# Patient Record
Sex: Female | Born: 1965 | Race: Black or African American | Hispanic: No
Health system: Southern US, Community
[De-identification: ages and names within clinical notes are randomized; demographics above are authoritative.]

---

## 2000-07-30 ENCOUNTER — Emergency Department (HOSPITAL_COMMUNITY): Admission: EM | Admit: 2000-07-30 | Discharge: 2000-07-30 | Payer: Self-pay | Admitting: Emergency Medicine

## 2000-07-30 ENCOUNTER — Encounter: Payer: Self-pay | Admitting: Emergency Medicine

## 2000-07-31 ENCOUNTER — Ambulatory Visit (HOSPITAL_COMMUNITY): Admission: RE | Admit: 2000-07-31 | Discharge: 2000-07-31 | Payer: Self-pay | Admitting: General Surgery

## 2000-07-31 ENCOUNTER — Encounter: Payer: Self-pay | Admitting: General Surgery

## 2001-03-15 ENCOUNTER — Encounter: Payer: Self-pay | Admitting: Obstetrics and Gynecology

## 2001-03-15 ENCOUNTER — Ambulatory Visit (HOSPITAL_COMMUNITY): Admission: RE | Admit: 2001-03-15 | Discharge: 2001-03-15 | Payer: Self-pay | Admitting: Obstetrics and Gynecology

## 2001-06-25 ENCOUNTER — Encounter: Payer: Self-pay | Admitting: Obstetrics and Gynecology

## 2001-06-25 ENCOUNTER — Ambulatory Visit (HOSPITAL_COMMUNITY): Admission: RE | Admit: 2001-06-25 | Discharge: 2001-06-25 | Payer: Self-pay | Admitting: Obstetrics and Gynecology

## 2001-12-02 ENCOUNTER — Encounter (HOSPITAL_COMMUNITY): Admission: RE | Admit: 2001-12-02 | Discharge: 2002-01-01 | Payer: Self-pay | Admitting: Preventative Medicine

## 2002-09-08 ENCOUNTER — Ambulatory Visit (HOSPITAL_COMMUNITY): Admission: RE | Admit: 2002-09-08 | Discharge: 2002-09-08 | Payer: Self-pay | Admitting: Obstetrics and Gynecology

## 2002-09-08 ENCOUNTER — Encounter: Payer: Self-pay | Admitting: Obstetrics and Gynecology

## 2003-10-17 ENCOUNTER — Ambulatory Visit (HOSPITAL_COMMUNITY): Admission: RE | Admit: 2003-10-17 | Discharge: 2003-10-17 | Payer: Self-pay | Admitting: Internal Medicine

## 2005-03-25 ENCOUNTER — Ambulatory Visit (HOSPITAL_COMMUNITY): Admission: RE | Admit: 2005-03-25 | Discharge: 2005-03-25 | Payer: Self-pay | Admitting: *Deleted

## 2006-04-09 ENCOUNTER — Ambulatory Visit (HOSPITAL_COMMUNITY): Admission: RE | Admit: 2006-04-09 | Discharge: 2006-04-09 | Payer: Self-pay | Admitting: Unknown Physician Specialty

## 2007-04-12 ENCOUNTER — Ambulatory Visit (HOSPITAL_COMMUNITY): Admission: RE | Admit: 2007-04-12 | Discharge: 2007-04-12 | Payer: Self-pay | Admitting: Unknown Physician Specialty

## 2008-04-28 ENCOUNTER — Ambulatory Visit (HOSPITAL_COMMUNITY): Admission: RE | Admit: 2008-04-28 | Discharge: 2008-04-28 | Payer: Self-pay | Admitting: Unknown Physician Specialty

## 2009-05-17 ENCOUNTER — Ambulatory Visit (HOSPITAL_COMMUNITY): Admission: RE | Admit: 2009-05-17 | Discharge: 2009-05-17 | Payer: Self-pay | Admitting: Unknown Physician Specialty

## 2010-06-10 ENCOUNTER — Other Ambulatory Visit (HOSPITAL_COMMUNITY): Payer: Self-pay | Admitting: Unknown Physician Specialty

## 2010-06-10 DIAGNOSIS — Z139 Encounter for screening, unspecified: Secondary | ICD-10-CM

## 2010-06-13 ENCOUNTER — Ambulatory Visit (HOSPITAL_COMMUNITY): Payer: 59

## 2010-06-17 ENCOUNTER — Ambulatory Visit (HOSPITAL_COMMUNITY)
Admission: RE | Admit: 2010-06-17 | Discharge: 2010-06-17 | Disposition: A | Discharge status: Home / Self Care | Payer: 59 | Source: Ambulatory Visit | Attending: Unknown Physician Specialty | Admitting: Unknown Physician Specialty

## 2010-06-17 DIAGNOSIS — Z1231 Encounter for screening mammogram for malignant neoplasm of breast: Secondary | ICD-10-CM | POA: Insufficient documentation

## 2010-06-17 DIAGNOSIS — Z139 Encounter for screening, unspecified: Secondary | ICD-10-CM

## 2010-06-19 ENCOUNTER — Other Ambulatory Visit: Payer: Self-pay | Admitting: Obstetrics and Gynecology

## 2010-06-19 DIAGNOSIS — R928 Other abnormal and inconclusive findings on diagnostic imaging of breast: Secondary | ICD-10-CM

## 2010-06-26 ENCOUNTER — Ambulatory Visit (HOSPITAL_COMMUNITY)
Admission: RE | Admit: 2010-06-26 | Discharge: 2010-06-26 | Disposition: A | Discharge status: Home / Self Care | Payer: 59 | Source: Ambulatory Visit | Attending: Unknown Physician Specialty | Admitting: Unknown Physician Specialty

## 2010-06-26 ENCOUNTER — Other Ambulatory Visit: Payer: Self-pay | Admitting: Obstetrics and Gynecology

## 2010-06-26 ENCOUNTER — Ambulatory Visit (HOSPITAL_COMMUNITY)
Admission: RE | Admit: 2010-06-26 | Discharge: 2010-06-26 | Disposition: A | Discharge status: Home / Self Care | Payer: 59 | Source: Ambulatory Visit | Attending: Obstetrics and Gynecology | Admitting: Obstetrics and Gynecology

## 2010-06-26 DIAGNOSIS — R928 Other abnormal and inconclusive findings on diagnostic imaging of breast: Secondary | ICD-10-CM

## 2011-06-23 ENCOUNTER — Other Ambulatory Visit (HOSPITAL_COMMUNITY): Payer: Self-pay | Admitting: Physician Assistant

## 2011-06-23 ENCOUNTER — Other Ambulatory Visit (HOSPITAL_COMMUNITY): Payer: Self-pay | Admitting: Unknown Physician Specialty

## 2011-06-23 DIAGNOSIS — Z139 Encounter for screening, unspecified: Secondary | ICD-10-CM

## 2011-06-30 ENCOUNTER — Ambulatory Visit (HOSPITAL_COMMUNITY)
Admission: RE | Admit: 2011-06-30 | Discharge: 2011-06-30 | Disposition: A | Discharge status: Home / Self Care | Payer: 59 | Source: Ambulatory Visit | Attending: Physician Assistant | Admitting: Physician Assistant

## 2011-06-30 ENCOUNTER — Ambulatory Visit (HOSPITAL_COMMUNITY): Payer: 59

## 2011-06-30 DIAGNOSIS — Z139 Encounter for screening, unspecified: Secondary | ICD-10-CM

## 2011-06-30 DIAGNOSIS — Z1231 Encounter for screening mammogram for malignant neoplasm of breast: Secondary | ICD-10-CM | POA: Insufficient documentation

## 2012-08-12 ENCOUNTER — Other Ambulatory Visit (HOSPITAL_COMMUNITY): Payer: Self-pay | Admitting: Physician Assistant

## 2012-08-12 DIAGNOSIS — Z139 Encounter for screening, unspecified: Secondary | ICD-10-CM

## 2012-08-16 ENCOUNTER — Ambulatory Visit (HOSPITAL_COMMUNITY)
Admission: RE | Admit: 2012-08-16 | Discharge: 2012-08-16 | Disposition: A | Discharge status: Home / Self Care | Payer: 59 | Source: Ambulatory Visit | Attending: Physician Assistant | Admitting: Physician Assistant

## 2012-08-16 DIAGNOSIS — Z139 Encounter for screening, unspecified: Secondary | ICD-10-CM

## 2012-08-16 DIAGNOSIS — Z1231 Encounter for screening mammogram for malignant neoplasm of breast: Secondary | ICD-10-CM | POA: Insufficient documentation

## 2013-08-23 ENCOUNTER — Other Ambulatory Visit (HOSPITAL_COMMUNITY): Payer: Self-pay | Admitting: Physician Assistant

## 2013-08-23 DIAGNOSIS — Z1231 Encounter for screening mammogram for malignant neoplasm of breast: Secondary | ICD-10-CM

## 2013-08-26 ENCOUNTER — Ambulatory Visit (HOSPITAL_COMMUNITY): Payer: 59

## 2013-08-29 ENCOUNTER — Ambulatory Visit (HOSPITAL_COMMUNITY)
Admission: RE | Admit: 2013-08-29 | Discharge: 2013-08-29 | Disposition: A | Discharge status: Home / Self Care | Payer: 59 | Source: Ambulatory Visit | Attending: Physician Assistant | Admitting: Physician Assistant

## 2013-08-29 DIAGNOSIS — R928 Other abnormal and inconclusive findings on diagnostic imaging of breast: Secondary | ICD-10-CM | POA: Insufficient documentation

## 2013-08-29 DIAGNOSIS — Z1231 Encounter for screening mammogram for malignant neoplasm of breast: Secondary | ICD-10-CM | POA: Insufficient documentation

## 2013-08-31 ENCOUNTER — Other Ambulatory Visit: Payer: Self-pay | Admitting: Physician Assistant

## 2013-08-31 DIAGNOSIS — R928 Other abnormal and inconclusive findings on diagnostic imaging of breast: Secondary | ICD-10-CM

## 2013-09-14 ENCOUNTER — Other Ambulatory Visit: Payer: Self-pay | Admitting: Physician Assistant

## 2013-09-14 ENCOUNTER — Ambulatory Visit (HOSPITAL_COMMUNITY)
Admission: RE | Admit: 2013-09-14 | Discharge: 2013-09-14 | Disposition: A | Discharge status: Home / Self Care | Payer: 59 | Source: Ambulatory Visit | Attending: Physician Assistant | Admitting: Physician Assistant

## 2013-09-14 DIAGNOSIS — R928 Other abnormal and inconclusive findings on diagnostic imaging of breast: Secondary | ICD-10-CM

## 2014-11-07 ENCOUNTER — Other Ambulatory Visit (HOSPITAL_COMMUNITY): Payer: Self-pay | Admitting: Physician Assistant

## 2014-11-07 DIAGNOSIS — Z1231 Encounter for screening mammogram for malignant neoplasm of breast: Secondary | ICD-10-CM

## 2014-11-10 ENCOUNTER — Ambulatory Visit (HOSPITAL_COMMUNITY)
Admission: RE | Admit: 2014-11-10 | Discharge: 2014-11-10 | Disposition: A | Discharge status: Home / Self Care | Payer: BLUE CROSS/BLUE SHIELD | Source: Ambulatory Visit | Attending: Physician Assistant | Admitting: Physician Assistant

## 2014-11-10 DIAGNOSIS — Z1231 Encounter for screening mammogram for malignant neoplasm of breast: Secondary | ICD-10-CM

## 2015-12-18 ENCOUNTER — Other Ambulatory Visit (HOSPITAL_COMMUNITY): Payer: Self-pay | Admitting: Physician Assistant

## 2015-12-18 DIAGNOSIS — Z1231 Encounter for screening mammogram for malignant neoplasm of breast: Secondary | ICD-10-CM

## 2015-12-24 ENCOUNTER — Ambulatory Visit (HOSPITAL_COMMUNITY): Payer: BLUE CROSS/BLUE SHIELD

## 2015-12-31 ENCOUNTER — Ambulatory Visit (HOSPITAL_COMMUNITY)
Admission: RE | Admit: 2015-12-31 | Discharge: 2015-12-31 | Disposition: A | Discharge status: Home / Self Care | Payer: BLUE CROSS/BLUE SHIELD | Source: Ambulatory Visit | Attending: Physician Assistant | Admitting: Physician Assistant

## 2015-12-31 DIAGNOSIS — Z1231 Encounter for screening mammogram for malignant neoplasm of breast: Secondary | ICD-10-CM | POA: Diagnosis not present

## 2016-08-07 ENCOUNTER — Ambulatory Visit (INDEPENDENT_AMBULATORY_CARE_PROVIDER_SITE_OTHER): Payer: BLUE CROSS/BLUE SHIELD | Admitting: Otolaryngology

## 2016-08-07 DIAGNOSIS — H903 Sensorineural hearing loss, bilateral: Secondary | ICD-10-CM | POA: Diagnosis not present

## 2016-08-07 DIAGNOSIS — H6123 Impacted cerumen, bilateral: Secondary | ICD-10-CM

## 2017-04-06 DIAGNOSIS — Z79899 Other long term (current) drug therapy: Secondary | ICD-10-CM | POA: Diagnosis not present

## 2017-04-06 DIAGNOSIS — K029 Dental caries, unspecified: Secondary | ICD-10-CM | POA: Diagnosis not present

## 2017-04-06 DIAGNOSIS — M1990 Unspecified osteoarthritis, unspecified site: Secondary | ICD-10-CM | POA: Diagnosis not present

## 2017-04-06 DIAGNOSIS — I10 Essential (primary) hypertension: Secondary | ICD-10-CM | POA: Diagnosis not present

## 2017-04-06 DIAGNOSIS — K0889 Other specified disorders of teeth and supporting structures: Secondary | ICD-10-CM | POA: Diagnosis not present

## 2017-04-15 DIAGNOSIS — I10 Essential (primary) hypertension: Secondary | ICD-10-CM | POA: Diagnosis not present

## 2017-04-15 DIAGNOSIS — N760 Acute vaginitis: Secondary | ICD-10-CM | POA: Diagnosis not present

## 2017-04-15 DIAGNOSIS — Z90711 Acquired absence of uterus with remaining cervical stump: Secondary | ICD-10-CM | POA: Diagnosis not present

## 2017-04-15 DIAGNOSIS — N939 Abnormal uterine and vaginal bleeding, unspecified: Secondary | ICD-10-CM | POA: Diagnosis not present

## 2017-04-15 DIAGNOSIS — Z79899 Other long term (current) drug therapy: Secondary | ICD-10-CM | POA: Diagnosis not present

## 2017-05-05 ENCOUNTER — Other Ambulatory Visit (HOSPITAL_COMMUNITY): Payer: Self-pay | Admitting: Physician Assistant

## 2017-05-05 DIAGNOSIS — Z1231 Encounter for screening mammogram for malignant neoplasm of breast: Secondary | ICD-10-CM

## 2017-05-06 DIAGNOSIS — R300 Dysuria: Secondary | ICD-10-CM | POA: Diagnosis not present

## 2017-05-06 DIAGNOSIS — R1084 Generalized abdominal pain: Secondary | ICD-10-CM | POA: Diagnosis not present

## 2017-05-06 DIAGNOSIS — Z6838 Body mass index (BMI) 38.0-38.9, adult: Secondary | ICD-10-CM | POA: Diagnosis not present

## 2017-05-11 ENCOUNTER — Ambulatory Visit (HOSPITAL_COMMUNITY): Payer: BLUE CROSS/BLUE SHIELD

## 2017-05-13 ENCOUNTER — Ambulatory Visit (HOSPITAL_COMMUNITY): Payer: BLUE CROSS/BLUE SHIELD

## 2017-05-18 ENCOUNTER — Ambulatory Visit (HOSPITAL_COMMUNITY)
Admission: RE | Admit: 2017-05-18 | Discharge: 2017-05-18 | Disposition: A | Discharge status: Home / Self Care | Payer: BLUE CROSS/BLUE SHIELD | Source: Ambulatory Visit | Attending: Physician Assistant | Admitting: Physician Assistant

## 2017-05-18 DIAGNOSIS — Z1231 Encounter for screening mammogram for malignant neoplasm of breast: Secondary | ICD-10-CM | POA: Diagnosis not present

## 2017-07-21 DIAGNOSIS — Z0000 Encounter for general adult medical examination without abnormal findings: Secondary | ICD-10-CM | POA: Diagnosis not present

## 2017-07-22 DIAGNOSIS — D485 Neoplasm of uncertain behavior of skin: Secondary | ICD-10-CM | POA: Diagnosis not present

## 2017-07-22 DIAGNOSIS — M2550 Pain in unspecified joint: Secondary | ICD-10-CM | POA: Diagnosis not present

## 2017-07-23 DIAGNOSIS — Z6838 Body mass index (BMI) 38.0-38.9, adult: Secondary | ICD-10-CM | POA: Diagnosis not present

## 2017-07-23 DIAGNOSIS — Z0000 Encounter for general adult medical examination without abnormal findings: Secondary | ICD-10-CM | POA: Diagnosis not present

## 2017-07-23 DIAGNOSIS — I10 Essential (primary) hypertension: Secondary | ICD-10-CM | POA: Diagnosis not present

## 2017-10-13 DIAGNOSIS — Z6841 Body Mass Index (BMI) 40.0 and over, adult: Secondary | ICD-10-CM | POA: Diagnosis not present

## 2017-10-13 DIAGNOSIS — Z1211 Encounter for screening for malignant neoplasm of colon: Secondary | ICD-10-CM | POA: Diagnosis not present

## 2017-10-27 DIAGNOSIS — I10 Essential (primary) hypertension: Secondary | ICD-10-CM | POA: Diagnosis not present

## 2017-10-27 DIAGNOSIS — Z9851 Tubal ligation status: Secondary | ICD-10-CM | POA: Diagnosis not present

## 2017-10-27 DIAGNOSIS — Z6841 Body Mass Index (BMI) 40.0 and over, adult: Secondary | ICD-10-CM | POA: Diagnosis not present

## 2017-10-27 DIAGNOSIS — M1990 Unspecified osteoarthritis, unspecified site: Secondary | ICD-10-CM | POA: Diagnosis not present

## 2017-10-27 DIAGNOSIS — Z90710 Acquired absence of both cervix and uterus: Secondary | ICD-10-CM | POA: Diagnosis not present

## 2017-10-27 DIAGNOSIS — E669 Obesity, unspecified: Secondary | ICD-10-CM | POA: Diagnosis not present

## 2017-10-27 DIAGNOSIS — Z79899 Other long term (current) drug therapy: Secondary | ICD-10-CM | POA: Diagnosis not present

## 2017-10-27 DIAGNOSIS — Z1211 Encounter for screening for malignant neoplasm of colon: Secondary | ICD-10-CM | POA: Diagnosis not present

## 2017-10-27 DIAGNOSIS — G4730 Sleep apnea, unspecified: Secondary | ICD-10-CM | POA: Diagnosis not present

## 2017-12-16 DIAGNOSIS — Z6841 Body Mass Index (BMI) 40.0 and over, adult: Secondary | ICD-10-CM | POA: Diagnosis not present

## 2017-12-16 DIAGNOSIS — I10 Essential (primary) hypertension: Secondary | ICD-10-CM | POA: Diagnosis not present

## 2017-12-16 DIAGNOSIS — Z1331 Encounter for screening for depression: Secondary | ICD-10-CM | POA: Diagnosis not present

## 2017-12-16 DIAGNOSIS — Z1389 Encounter for screening for other disorder: Secondary | ICD-10-CM | POA: Diagnosis not present

## 2017-12-24 DIAGNOSIS — Z6839 Body mass index (BMI) 39.0-39.9, adult: Secondary | ICD-10-CM | POA: Diagnosis not present

## 2017-12-24 DIAGNOSIS — I10 Essential (primary) hypertension: Secondary | ICD-10-CM | POA: Diagnosis not present

## 2017-12-24 DIAGNOSIS — N3001 Acute cystitis with hematuria: Secondary | ICD-10-CM | POA: Diagnosis not present

## 2018-01-18 DIAGNOSIS — Z23 Encounter for immunization: Secondary | ICD-10-CM | POA: Diagnosis not present

## 2018-02-01 DIAGNOSIS — R1084 Generalized abdominal pain: Secondary | ICD-10-CM | POA: Diagnosis not present

## 2018-02-01 DIAGNOSIS — Z6839 Body mass index (BMI) 39.0-39.9, adult: Secondary | ICD-10-CM | POA: Diagnosis not present

## 2018-02-03 DIAGNOSIS — R319 Hematuria, unspecified: Secondary | ICD-10-CM | POA: Diagnosis not present

## 2018-02-03 DIAGNOSIS — N898 Other specified noninflammatory disorders of vagina: Secondary | ICD-10-CM | POA: Diagnosis not present

## 2018-02-03 DIAGNOSIS — Z6841 Body Mass Index (BMI) 40.0 and over, adult: Secondary | ICD-10-CM | POA: Diagnosis not present

## 2018-02-15 DIAGNOSIS — R319 Hematuria, unspecified: Secondary | ICD-10-CM | POA: Diagnosis not present

## 2018-02-15 DIAGNOSIS — Z90710 Acquired absence of both cervix and uterus: Secondary | ICD-10-CM | POA: Diagnosis not present

## 2018-02-15 DIAGNOSIS — N898 Other specified noninflammatory disorders of vagina: Secondary | ICD-10-CM | POA: Diagnosis not present

## 2018-04-20 DIAGNOSIS — R05 Cough: Secondary | ICD-10-CM | POA: Diagnosis not present

## 2018-04-20 DIAGNOSIS — J0101 Acute recurrent maxillary sinusitis: Secondary | ICD-10-CM | POA: Diagnosis not present

## 2018-04-20 DIAGNOSIS — Z6839 Body mass index (BMI) 39.0-39.9, adult: Secondary | ICD-10-CM | POA: Diagnosis not present

## 2018-04-20 DIAGNOSIS — S83411A Sprain of medial collateral ligament of right knee, initial encounter: Secondary | ICD-10-CM | POA: Diagnosis not present

## 2018-04-25 DIAGNOSIS — N898 Other specified noninflammatory disorders of vagina: Secondary | ICD-10-CM | POA: Diagnosis not present

## 2018-04-25 DIAGNOSIS — R51 Headache: Secondary | ICD-10-CM | POA: Diagnosis not present

## 2018-04-25 DIAGNOSIS — N760 Acute vaginitis: Secondary | ICD-10-CM | POA: Diagnosis not present

## 2018-05-20 DIAGNOSIS — R600 Localized edema: Secondary | ICD-10-CM | POA: Diagnosis not present

## 2018-05-20 DIAGNOSIS — Z6838 Body mass index (BMI) 38.0-38.9, adult: Secondary | ICD-10-CM | POA: Diagnosis not present

## 2018-05-20 DIAGNOSIS — R05 Cough: Secondary | ICD-10-CM | POA: Diagnosis not present

## 2018-06-22 DIAGNOSIS — R300 Dysuria: Secondary | ICD-10-CM | POA: Diagnosis not present

## 2018-06-22 DIAGNOSIS — R05 Cough: Secondary | ICD-10-CM | POA: Diagnosis not present

## 2018-06-22 DIAGNOSIS — Z6838 Body mass index (BMI) 38.0-38.9, adult: Secondary | ICD-10-CM | POA: Diagnosis not present

## 2018-06-22 DIAGNOSIS — R350 Frequency of micturition: Secondary | ICD-10-CM | POA: Diagnosis not present

## 2018-06-23 DIAGNOSIS — R319 Hematuria, unspecified: Secondary | ICD-10-CM | POA: Diagnosis not present

## 2018-06-23 DIAGNOSIS — R1084 Generalized abdominal pain: Secondary | ICD-10-CM | POA: Diagnosis not present

## 2018-09-27 DIAGNOSIS — Z6841 Body Mass Index (BMI) 40.0 and over, adult: Secondary | ICD-10-CM | POA: Diagnosis not present

## 2018-09-27 DIAGNOSIS — I10 Essential (primary) hypertension: Secondary | ICD-10-CM | POA: Diagnosis not present

## 2018-11-08 DIAGNOSIS — Z6839 Body mass index (BMI) 39.0-39.9, adult: Secondary | ICD-10-CM | POA: Diagnosis not present

## 2018-11-08 DIAGNOSIS — I10 Essential (primary) hypertension: Secondary | ICD-10-CM | POA: Diagnosis not present

## 2018-12-21 ENCOUNTER — Other Ambulatory Visit (HOSPITAL_COMMUNITY): Payer: Self-pay | Admitting: Physician Assistant

## 2018-12-21 DIAGNOSIS — Z1231 Encounter for screening mammogram for malignant neoplasm of breast: Secondary | ICD-10-CM

## 2018-12-29 ENCOUNTER — Ambulatory Visit (HOSPITAL_COMMUNITY): Payer: BLUE CROSS/BLUE SHIELD

## 2019-01-03 ENCOUNTER — Ambulatory Visit (HOSPITAL_COMMUNITY)
Admission: RE | Admit: 2019-01-03 | Discharge: 2019-01-03 | Disposition: A | Discharge status: Home / Self Care | Payer: BC Managed Care – PPO | Source: Ambulatory Visit | Attending: Physician Assistant | Admitting: Physician Assistant

## 2019-01-03 ENCOUNTER — Other Ambulatory Visit: Payer: Self-pay

## 2019-01-03 DIAGNOSIS — Z1231 Encounter for screening mammogram for malignant neoplasm of breast: Secondary | ICD-10-CM | POA: Insufficient documentation

## 2019-01-18 DIAGNOSIS — Z6839 Body mass index (BMI) 39.0-39.9, adult: Secondary | ICD-10-CM | POA: Diagnosis not present

## 2019-01-18 DIAGNOSIS — N3000 Acute cystitis without hematuria: Secondary | ICD-10-CM | POA: Diagnosis not present

## 2019-02-09 DIAGNOSIS — R1011 Right upper quadrant pain: Secondary | ICD-10-CM | POA: Diagnosis not present

## 2019-02-09 DIAGNOSIS — Z6839 Body mass index (BMI) 39.0-39.9, adult: Secondary | ICD-10-CM | POA: Diagnosis not present

## 2019-03-15 DIAGNOSIS — Z6839 Body mass index (BMI) 39.0-39.9, adult: Secondary | ICD-10-CM | POA: Diagnosis not present

## 2019-03-15 DIAGNOSIS — I10 Essential (primary) hypertension: Secondary | ICD-10-CM | POA: Diagnosis not present

## 2019-04-11 DIAGNOSIS — Z0000 Encounter for general adult medical examination without abnormal findings: Secondary | ICD-10-CM | POA: Diagnosis not present

## 2019-04-21 DIAGNOSIS — Z01419 Encounter for gynecological examination (general) (routine) without abnormal findings: Secondary | ICD-10-CM | POA: Diagnosis not present

## 2019-04-21 DIAGNOSIS — Z6841 Body Mass Index (BMI) 40.0 and over, adult: Secondary | ICD-10-CM | POA: Diagnosis not present

## 2019-04-29 DIAGNOSIS — Z0000 Encounter for general adult medical examination without abnormal findings: Secondary | ICD-10-CM | POA: Diagnosis not present

## 2019-07-01 DIAGNOSIS — J309 Allergic rhinitis, unspecified: Secondary | ICD-10-CM | POA: Diagnosis not present

## 2019-07-01 DIAGNOSIS — Z6841 Body Mass Index (BMI) 40.0 and over, adult: Secondary | ICD-10-CM | POA: Diagnosis not present

## 2019-07-01 DIAGNOSIS — K047 Periapical abscess without sinus: Secondary | ICD-10-CM | POA: Diagnosis not present

## 2019-07-01 DIAGNOSIS — H04123 Dry eye syndrome of bilateral lacrimal glands: Secondary | ICD-10-CM | POA: Diagnosis not present

## 2019-09-08 DIAGNOSIS — R319 Hematuria, unspecified: Secondary | ICD-10-CM | POA: Diagnosis not present

## 2019-09-08 DIAGNOSIS — Z6841 Body Mass Index (BMI) 40.0 and over, adult: Secondary | ICD-10-CM | POA: Diagnosis not present

## 2019-09-08 DIAGNOSIS — N760 Acute vaginitis: Secondary | ICD-10-CM | POA: Diagnosis not present

## 2019-09-08 DIAGNOSIS — R5383 Other fatigue: Secondary | ICD-10-CM | POA: Diagnosis not present

## 2019-11-11 DIAGNOSIS — I10 Essential (primary) hypertension: Secondary | ICD-10-CM | POA: Diagnosis not present

## 2019-11-11 DIAGNOSIS — Z1331 Encounter for screening for depression: Secondary | ICD-10-CM | POA: Diagnosis not present

## 2019-11-11 DIAGNOSIS — Z1389 Encounter for screening for other disorder: Secondary | ICD-10-CM | POA: Diagnosis not present

## 2019-11-11 DIAGNOSIS — Z6841 Body Mass Index (BMI) 40.0 and over, adult: Secondary | ICD-10-CM | POA: Diagnosis not present

## 2019-12-19 DIAGNOSIS — H10013 Acute follicular conjunctivitis, bilateral: Secondary | ICD-10-CM | POA: Diagnosis not present

## 2020-01-27 DIAGNOSIS — Z6841 Body Mass Index (BMI) 40.0 and over, adult: Secondary | ICD-10-CM | POA: Diagnosis not present

## 2020-01-27 DIAGNOSIS — R350 Frequency of micturition: Secondary | ICD-10-CM | POA: Diagnosis not present

## 2020-01-27 DIAGNOSIS — L304 Erythema intertrigo: Secondary | ICD-10-CM | POA: Diagnosis not present

## 2020-02-01 DIAGNOSIS — N9089 Other specified noninflammatory disorders of vulva and perineum: Secondary | ICD-10-CM | POA: Diagnosis not present

## 2020-02-01 DIAGNOSIS — Z6841 Body Mass Index (BMI) 40.0 and over, adult: Secondary | ICD-10-CM | POA: Diagnosis not present

## 2020-05-09 ENCOUNTER — Other Ambulatory Visit (HOSPITAL_COMMUNITY): Payer: Self-pay | Admitting: Physician Assistant

## 2020-05-09 DIAGNOSIS — Z1231 Encounter for screening mammogram for malignant neoplasm of breast: Secondary | ICD-10-CM

## 2020-05-14 ENCOUNTER — Ambulatory Visit (HOSPITAL_COMMUNITY): Payer: BC Managed Care – PPO

## 2020-05-23 ENCOUNTER — Ambulatory Visit (HOSPITAL_COMMUNITY): Payer: BC Managed Care – PPO

## 2020-06-20 ENCOUNTER — Ambulatory Visit (HOSPITAL_COMMUNITY)
Admission: RE | Admit: 2020-06-20 | Discharge: 2020-06-20 | Disposition: A | Discharge status: Home / Self Care | Payer: BC Managed Care – PPO | Source: Ambulatory Visit | Attending: Physician Assistant | Admitting: Physician Assistant

## 2020-06-20 ENCOUNTER — Other Ambulatory Visit: Payer: Self-pay

## 2020-06-20 DIAGNOSIS — Z1231 Encounter for screening mammogram for malignant neoplasm of breast: Secondary | ICD-10-CM | POA: Insufficient documentation

## 2021-05-29 IMAGING — MG MM DIGITAL SCREENING BILAT W/ TOMO AND CAD
6 of 10 series · 6 of 30 positions shown · non-contrast
Comparison: Previous exam(s).

CLINICAL DATA: Screening.

EXAM:
DIGITAL SCREENING BILATERAL MAMMOGRAM WITH TOMOSYNTHESIS AND CAD
TECHNIQUE: Bilateral screening digital craniocaudal and mediolateral oblique
mammograms were obtained. Bilateral screening digital breast
tomosynthesis was performed. The images were evaluated with
computer-aided detection.

[R CC synth-2D]
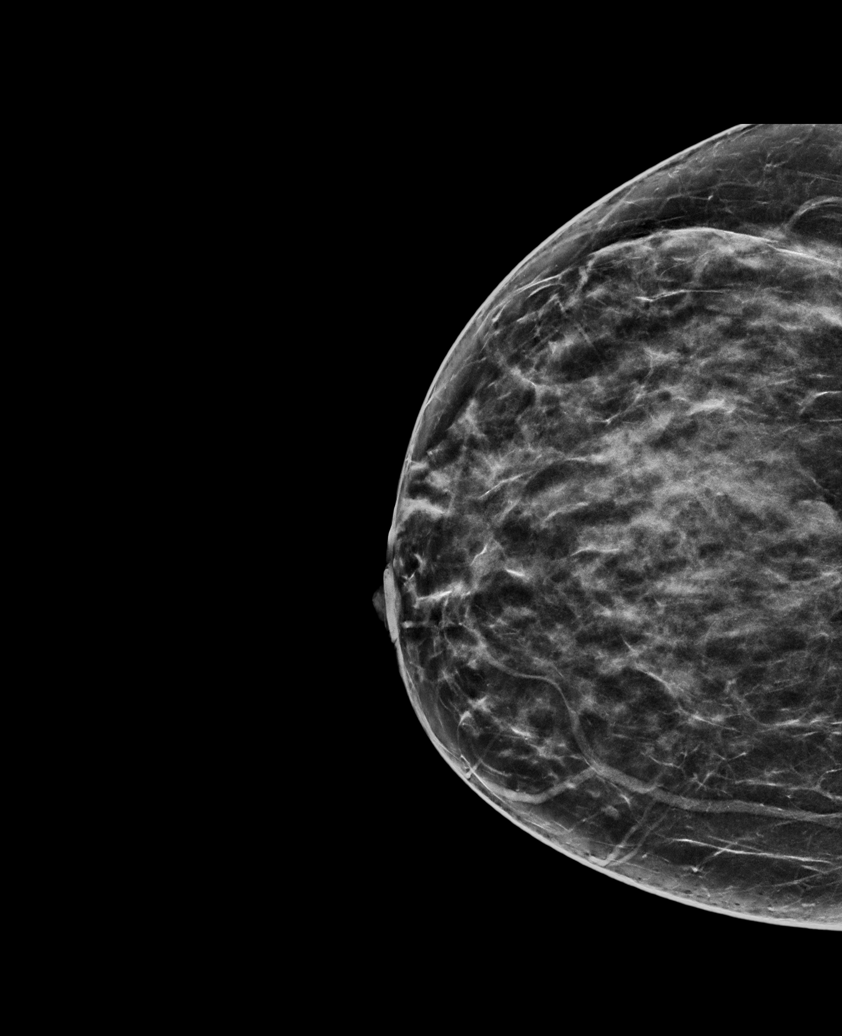

[L MLO synth-2D (1 of 2)]
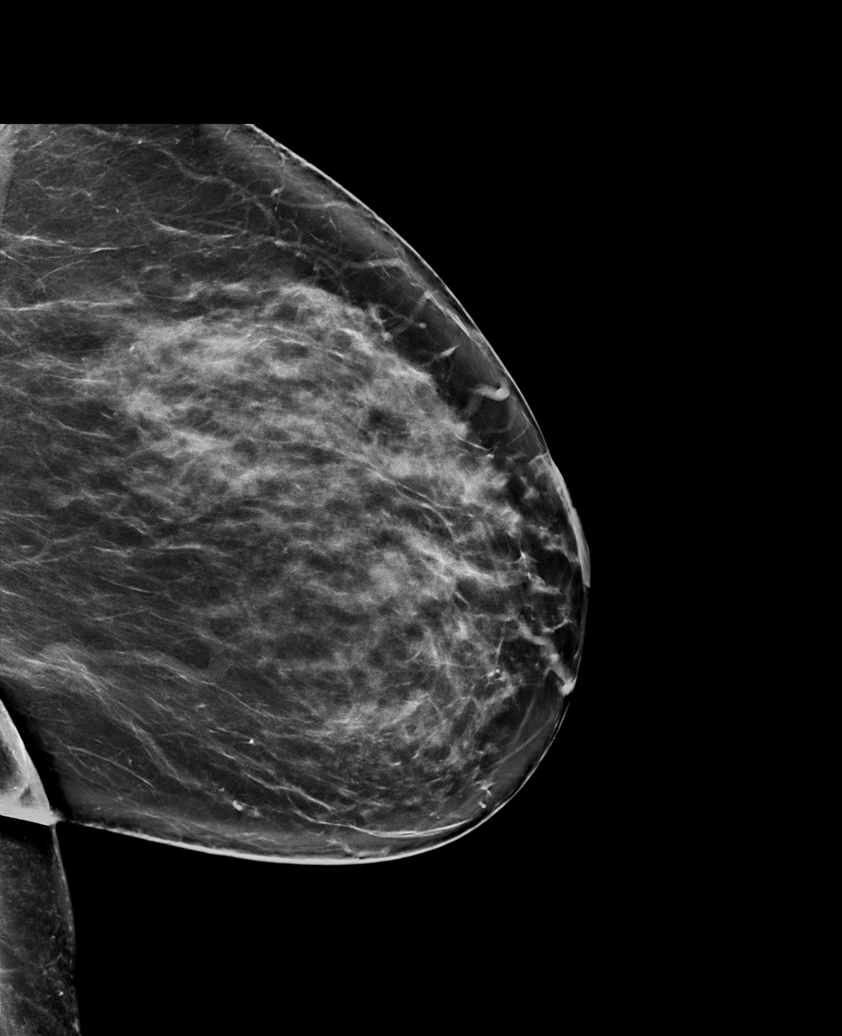

[L MLO synth-2D (2 of 2)]
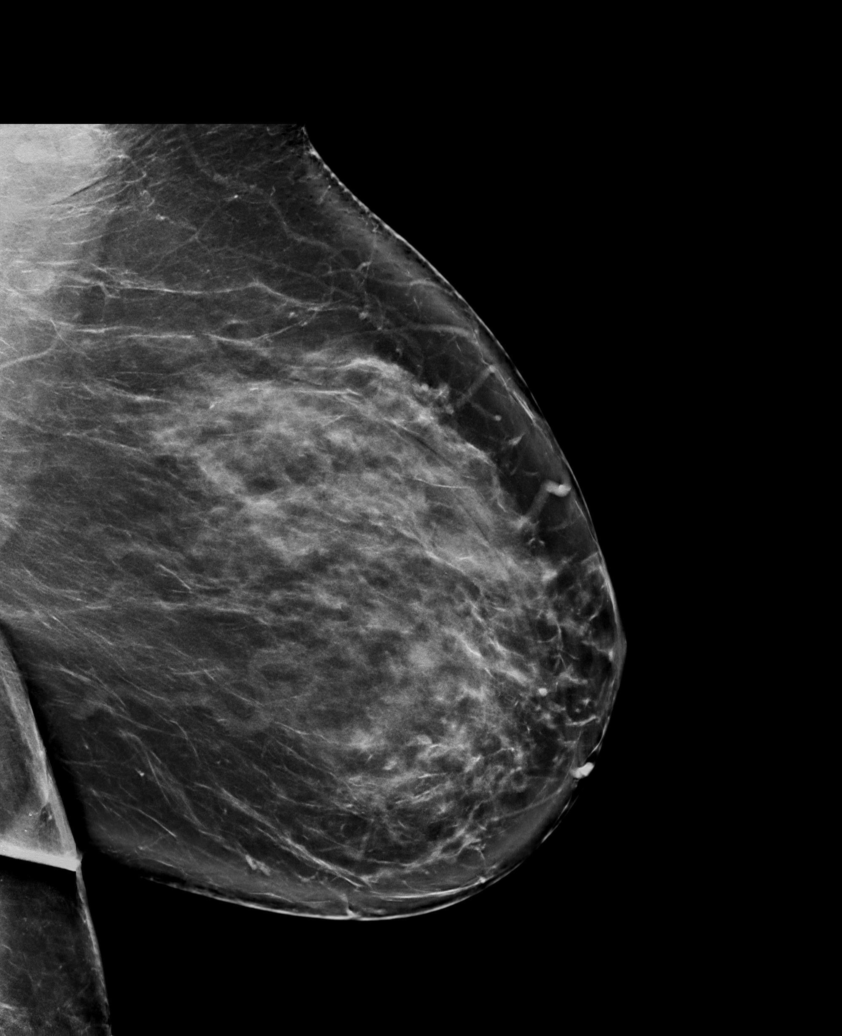

[L CC synth-2D]
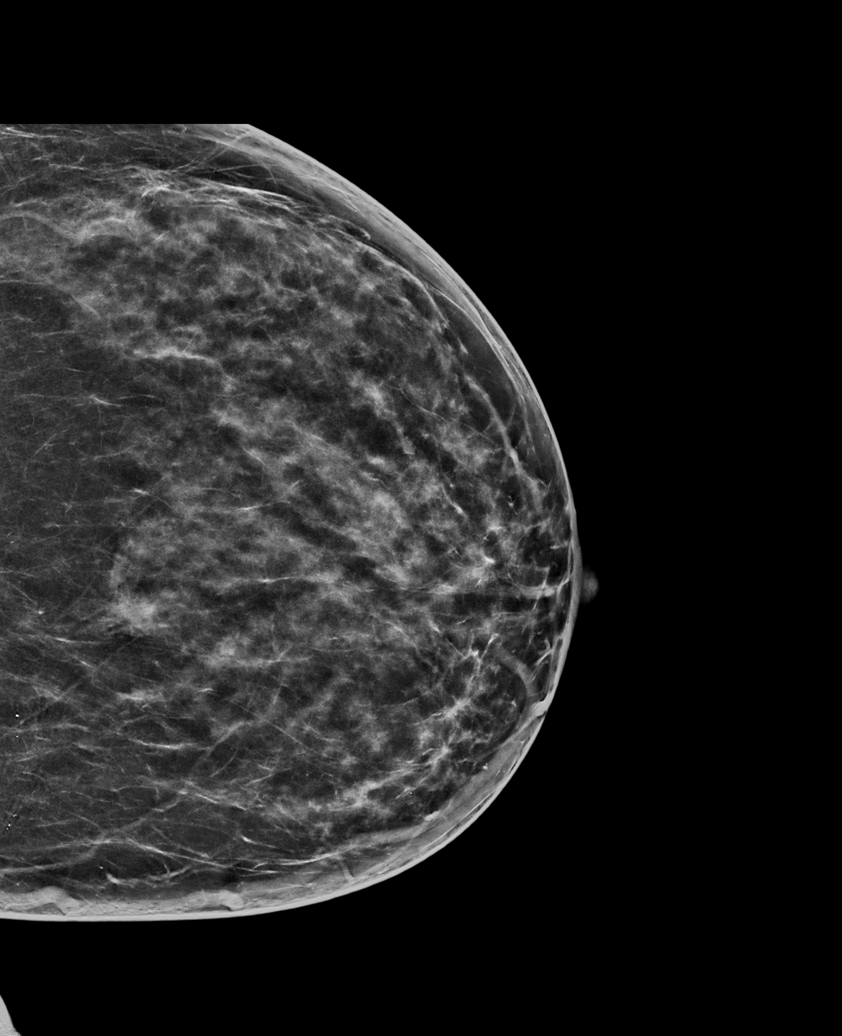

[R MLO synth-2D]
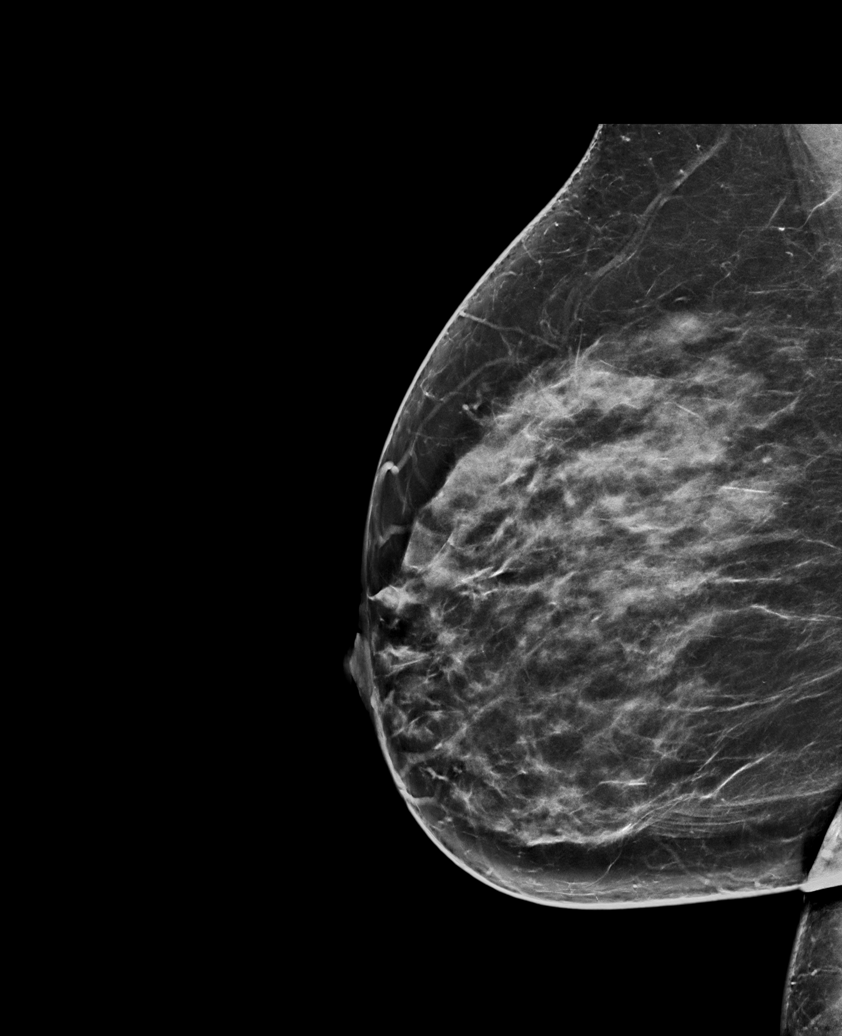

[R CC tomo · tomo slice 36/71.0]
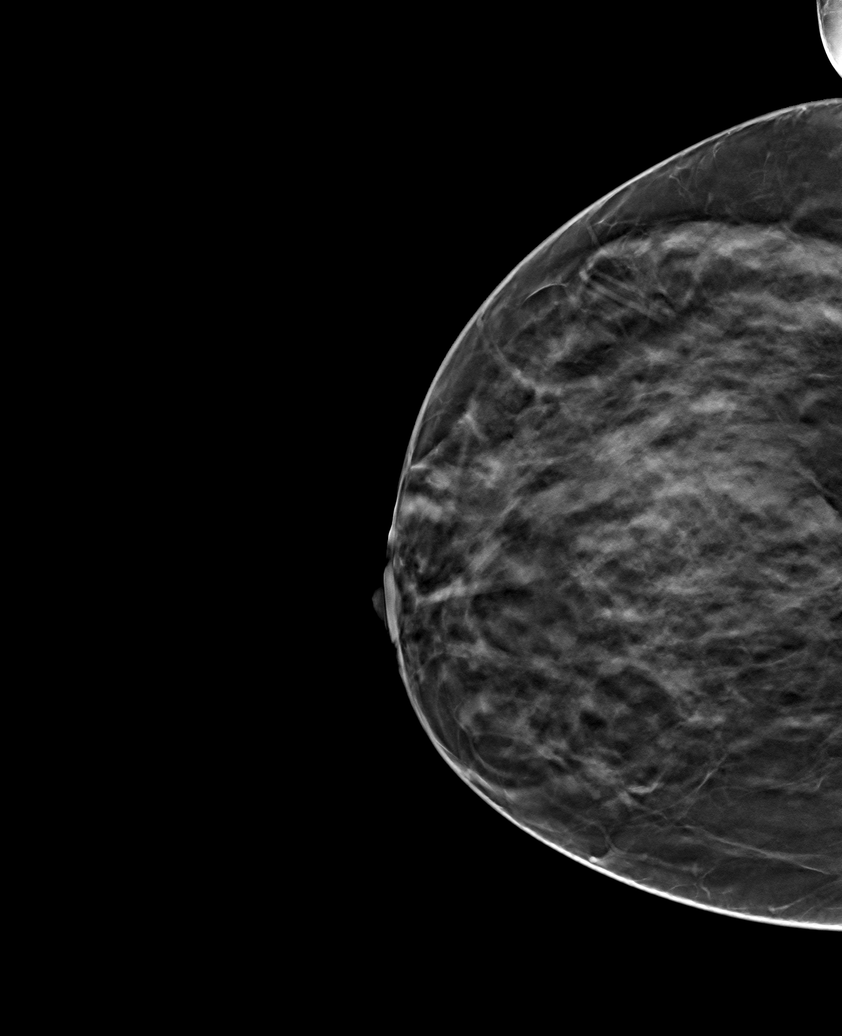

[6 of 30 positions shown; findings below may reference images not displayed]

ACR Breast Density Category c: The breast tissue is heterogeneously
dense, which may obscure small masses.
FINDINGS: There are no findings suspicious for malignancy. The images were
evaluated with computer-aided detection.
IMPRESSION: No mammographic evidence of malignancy. A result letter of this
screening mammogram will be mailed directly to the patient.

RECOMMENDATION:
Screening mammogram in one year. (Code:T4-5-GWO)

BI-RADS CATEGORY  1: Negative.

## 2021-09-23 ENCOUNTER — Other Ambulatory Visit (HOSPITAL_COMMUNITY): Payer: Self-pay | Admitting: Physician Assistant

## 2021-09-23 DIAGNOSIS — Z1231 Encounter for screening mammogram for malignant neoplasm of breast: Secondary | ICD-10-CM

## 2021-09-30 ENCOUNTER — Ambulatory Visit (HOSPITAL_COMMUNITY): Payer: BC Managed Care – PPO

## 2021-10-09 ENCOUNTER — Ambulatory Visit (HOSPITAL_COMMUNITY)
Admission: RE | Admit: 2021-10-09 | Discharge: 2021-10-09 | Disposition: A | Discharge status: Home / Self Care | Payer: BC Managed Care – PPO | Source: Ambulatory Visit | Attending: Physician Assistant | Admitting: Physician Assistant

## 2021-10-09 DIAGNOSIS — Z1231 Encounter for screening mammogram for malignant neoplasm of breast: Secondary | ICD-10-CM | POA: Insufficient documentation

## 2021-10-16 ENCOUNTER — Other Ambulatory Visit (HOSPITAL_COMMUNITY): Payer: Self-pay | Admitting: Physician Assistant

## 2021-10-16 DIAGNOSIS — R928 Other abnormal and inconclusive findings on diagnostic imaging of breast: Secondary | ICD-10-CM

## 2021-11-05 ENCOUNTER — Ambulatory Visit (HOSPITAL_COMMUNITY)
Admission: RE | Admit: 2021-11-05 | Discharge: 2021-11-05 | Disposition: A | Discharge status: Home / Self Care | Payer: BC Managed Care – PPO | Source: Ambulatory Visit | Attending: Physician Assistant | Admitting: Physician Assistant

## 2021-11-05 DIAGNOSIS — R928 Other abnormal and inconclusive findings on diagnostic imaging of breast: Secondary | ICD-10-CM

## 2021-11-06 ENCOUNTER — Other Ambulatory Visit: Payer: Self-pay | Admitting: Physician Assistant

## 2021-11-06 ENCOUNTER — Other Ambulatory Visit (HOSPITAL_COMMUNITY): Payer: Self-pay | Admitting: Physician Assistant

## 2021-11-06 DIAGNOSIS — R921 Mammographic calcification found on diagnostic imaging of breast: Secondary | ICD-10-CM

## 2021-11-06 DIAGNOSIS — R928 Other abnormal and inconclusive findings on diagnostic imaging of breast: Secondary | ICD-10-CM

## 2021-11-27 ENCOUNTER — Other Ambulatory Visit: Payer: Self-pay | Admitting: Physician Assistant

## 2021-11-27 DIAGNOSIS — N63 Unspecified lump in unspecified breast: Secondary | ICD-10-CM
